# Patient Record
Sex: Male | Born: 1980 | Race: Black or African American | Hispanic: No | Marital: Single | Smoking: Never smoker
Health system: Southern US, Community
[De-identification: ages and names within clinical notes are randomized; demographics above are authoritative.]

---

## 2015-11-06 ENCOUNTER — Emergency Department (HOSPITAL_COMMUNITY): Payer: Self-pay

## 2015-11-06 ENCOUNTER — Encounter (HOSPITAL_COMMUNITY): Payer: Self-pay | Admitting: Emergency Medicine

## 2015-11-06 DIAGNOSIS — R630 Anorexia: Secondary | ICD-10-CM | POA: Insufficient documentation

## 2015-11-06 DIAGNOSIS — R0989 Other specified symptoms and signs involving the circulatory and respiratory systems: Secondary | ICD-10-CM | POA: Insufficient documentation

## 2015-11-06 DIAGNOSIS — M549 Dorsalgia, unspecified: Secondary | ICD-10-CM | POA: Insufficient documentation

## 2015-11-06 DIAGNOSIS — R05 Cough: Secondary | ICD-10-CM | POA: Insufficient documentation

## 2015-11-06 DIAGNOSIS — R509 Fever, unspecified: Secondary | ICD-10-CM | POA: Insufficient documentation

## 2015-11-06 DIAGNOSIS — R5383 Other fatigue: Secondary | ICD-10-CM | POA: Insufficient documentation

## 2015-11-06 NOTE — ED Notes (Signed)
Pt. reports productive cough , chest congestion , low/mid back pain and fatigue onset yesterday .

## 2015-11-07 ENCOUNTER — Emergency Department (HOSPITAL_COMMUNITY)
Admission: EM | Admit: 2015-11-07 | Discharge: 2015-11-07 | Disposition: A | Discharge status: Home / Self Care | Payer: Self-pay | Attending: Emergency Medicine | Admitting: Emergency Medicine

## 2015-11-07 DIAGNOSIS — R05 Cough: Secondary | ICD-10-CM

## 2015-11-07 DIAGNOSIS — R509 Fever, unspecified: Secondary | ICD-10-CM

## 2015-11-07 DIAGNOSIS — R059 Cough, unspecified: Secondary | ICD-10-CM

## 2015-11-07 MED ORDER — IBUPROFEN 800 MG PO TABS
800.0000 mg | ORAL_TABLET | Freq: Once | ORAL | Status: AC
Start: 2015-11-07 — End: 2015-11-07
  Administered 2015-11-07: 800 mg via ORAL
  Filled 2015-11-07: qty 1

## 2015-11-07 MED ORDER — OXYCODONE-ACETAMINOPHEN 5-325 MG PO TABS
1.0000 | ORAL_TABLET | Freq: Once | ORAL | Status: AC
  Administered 2015-11-07: 1 via ORAL
  Filled 2015-11-07: qty 1

## 2015-11-07 MED ORDER — BENZONATATE 100 MG PO CAPS: 100.0000 mg | ORAL_CAPSULE | Freq: Three times a day (TID) | ORAL | Status: AC

## 2015-11-07 MED ORDER — HYDROCODONE-HOMATROPINE 5-1.5 MG/5ML PO SYRP: 5.0000 mL | ORAL_SOLUTION | Freq: Four times a day (QID) | ORAL | Status: AC | PRN

## 2015-11-07 MED ORDER — IBUPROFEN 800 MG PO TABS: 800.0000 mg | ORAL_TABLET | Freq: Three times a day (TID) | ORAL | Status: AC

## 2015-11-07 NOTE — Discharge Instructions (Signed)

## 2015-11-07 NOTE — ED Provider Notes (Signed)
CSN: [id number]     Arrival date & time 11/06/15  2008 History   By signing my name below, I, Arlan Organ, attest that this documentation has been prepared under the direction and in the presence of Gilda Crease, MD.  Electronically Signed: Arlan Organ, ED Scribe. 11/07/2015. 1:11 AM.   Chief Complaint  Patient presents with  . Fever  . Cough  . Back Pain  . Fatigue   The history is provided by the patient. No language interpreter was used.    HPI Comments: Bobby Ferguson is a 35 y.o. male without any pertinent past medical history who presents to the Emergency Department complaining of constant, ongoing generalized body aches x 2 days. Pt also reports cough, chest congestion, lack of appetite, generalized weakness, HA, back pain, fever, and fatigue. No aggravating or alleviating factors. No OTC medications or home remedies attempted prior to arrival. No recent nausea, vomiting, diarrhea, or abdominal pain. No known allergies to medications.  PCP: No PCP Per Patient    History reviewed. No pertinent past medical history. History reviewed. No pertinent past surgical history. No family history on file. Social History  Substance Use Topics  . Smoking status: Never Smoker   . Smokeless tobacco: None  . Alcohol Use: Yes    Review of Systems  Constitutional: Positive for fever, appetite change and fatigue. Negative for chills.  HENT: Positive for congestion.   Respiratory: Positive for cough.   Cardiovascular: Negative for chest pain.  Gastrointestinal: Negative for nausea, vomiting, abdominal pain and diarrhea.  Musculoskeletal: Positive for myalgias and back pain.  Neurological: Positive for headaches.  Psychiatric/Behavioral: Negative for confusion.  All other systems reviewed and are negative.     Allergies  Review of patient's allergies indicates no known allergies.  Home Medications   Prior to Admission medications   Not on File   Triage Vitals: BP  131/82 mmHg  Pulse 84  Temp(Src) 99.9 F (37.7 C) (Oral)  Resp 14  Ht  (1.905 m)  Wt 203 lb (92.08 kg)  BMI 25.37 kg/m2  SpO2 99%   Physical Exam  Constitutional: He is oriented to person, place, and time. He appears well-developed and well-nourished. No distress.  HENT:  Head: Normocephalic and atraumatic.  Right Ear: Hearing normal.  Left Ear: Hearing normal.  Nose: Nose normal.  Mouth/Throat: Oropharynx is clear and moist and mucous membranes are normal.  Eyes: Conjunctivae and EOM are normal. Pupils are equal, round, and reactive to light.  Neck: Normal range of motion. Neck supple.  Cardiovascular: Regular rhythm, S1 normal and S2 normal.  Exam reveals no gallop and no friction rub.   No murmur heard. Pulmonary/Chest: Effort normal and breath sounds normal. No respiratory distress. He exhibits no tenderness.  Abdominal: Soft. Normal appearance and bowel sounds are normal. There is no hepatosplenomegaly. There is no tenderness. There is no rebound, no guarding, no tenderness at McBurney's point and negative Murphy's sign. No hernia.  Musculoskeletal: Normal range of motion.  Neurological: He is alert and oriented to person, place, and time. He has normal strength. No cranial nerve deficit or sensory deficit. Coordination normal. GCS eye subscore is 4. GCS verbal subscore is 5. GCS motor subscore is 6.  Skin: Skin is warm, dry and intact. No rash noted. No cyanosis.  Psychiatric: He has a normal mood and affect. His speech is normal and behavior is normal. Thought content normal.  Nursing note and vitals reviewed.   ED Course  Procedures (including  critical care time)  DIAGNOSTIC STUDIES: Oxygen Saturation is 99% on RA, Normal by my interpretation.    COORDINATION OF CARE: 1:08 AM- Will order CXR. Discussed treatment plan with pt at bedside and pt agreed to plan.     Labs Review Labs Reviewed - No data to display  Imaging Review Dg Chest 2 View  11/06/2015   CLINICAL DATA:  Productive cough and fever for 2 days. EXAM: CHEST  2 VIEW COMPARISON:  None. FINDINGS: The cardiomediastinal contours are normal. The lungs are clear. Pulmonary vasculature is normal. No consolidation, pleural effusion, or pneumothorax. No acute osseous abnormalities are seen. IMPRESSION: No acute pulmonary process. Electronically Signed   By: Rubye OaksMelanie  Ehinger M.D.   On: 11/06/2015 21:00   I have personally reviewed and evaluated these images and lab results as part of my medical decision-making.   EKG Interpretation None      MDM   Final diagnoses:  Cough  Fever    Patient presents to the emergency department for flulike illness. Patient has been experiencing fever, chills, myalgias, malaise with productive cough for 2 days. Examination is unremarkable. He is well-appearing. There is no hypoxia. Chest x-ray is clear, no evidence of pneumonia. Symptoms most consistent with viral illness, possibly influenza. Will treat symptomatically.  I personally performed the services described in this documentation, which was scribed in my presence. The recorded information has been reviewed and is accurate.   Gilda Creasehristopher J Pollina, MD 11/07/15 [phone]

## 2015-11-07 NOTE — ED Notes (Signed)
Pt A&OX4, ambulatory at d/c with steady gait, NAD 

## 2016-09-29 IMAGING — CR DG CHEST 2V
2 series · 2 of 2 positions shown · non-contrast
Comparison: None.

CLINICAL DATA: Productive cough and fever for 2 days.

EXAM:
CHEST  2 VIEW

[chest pa]
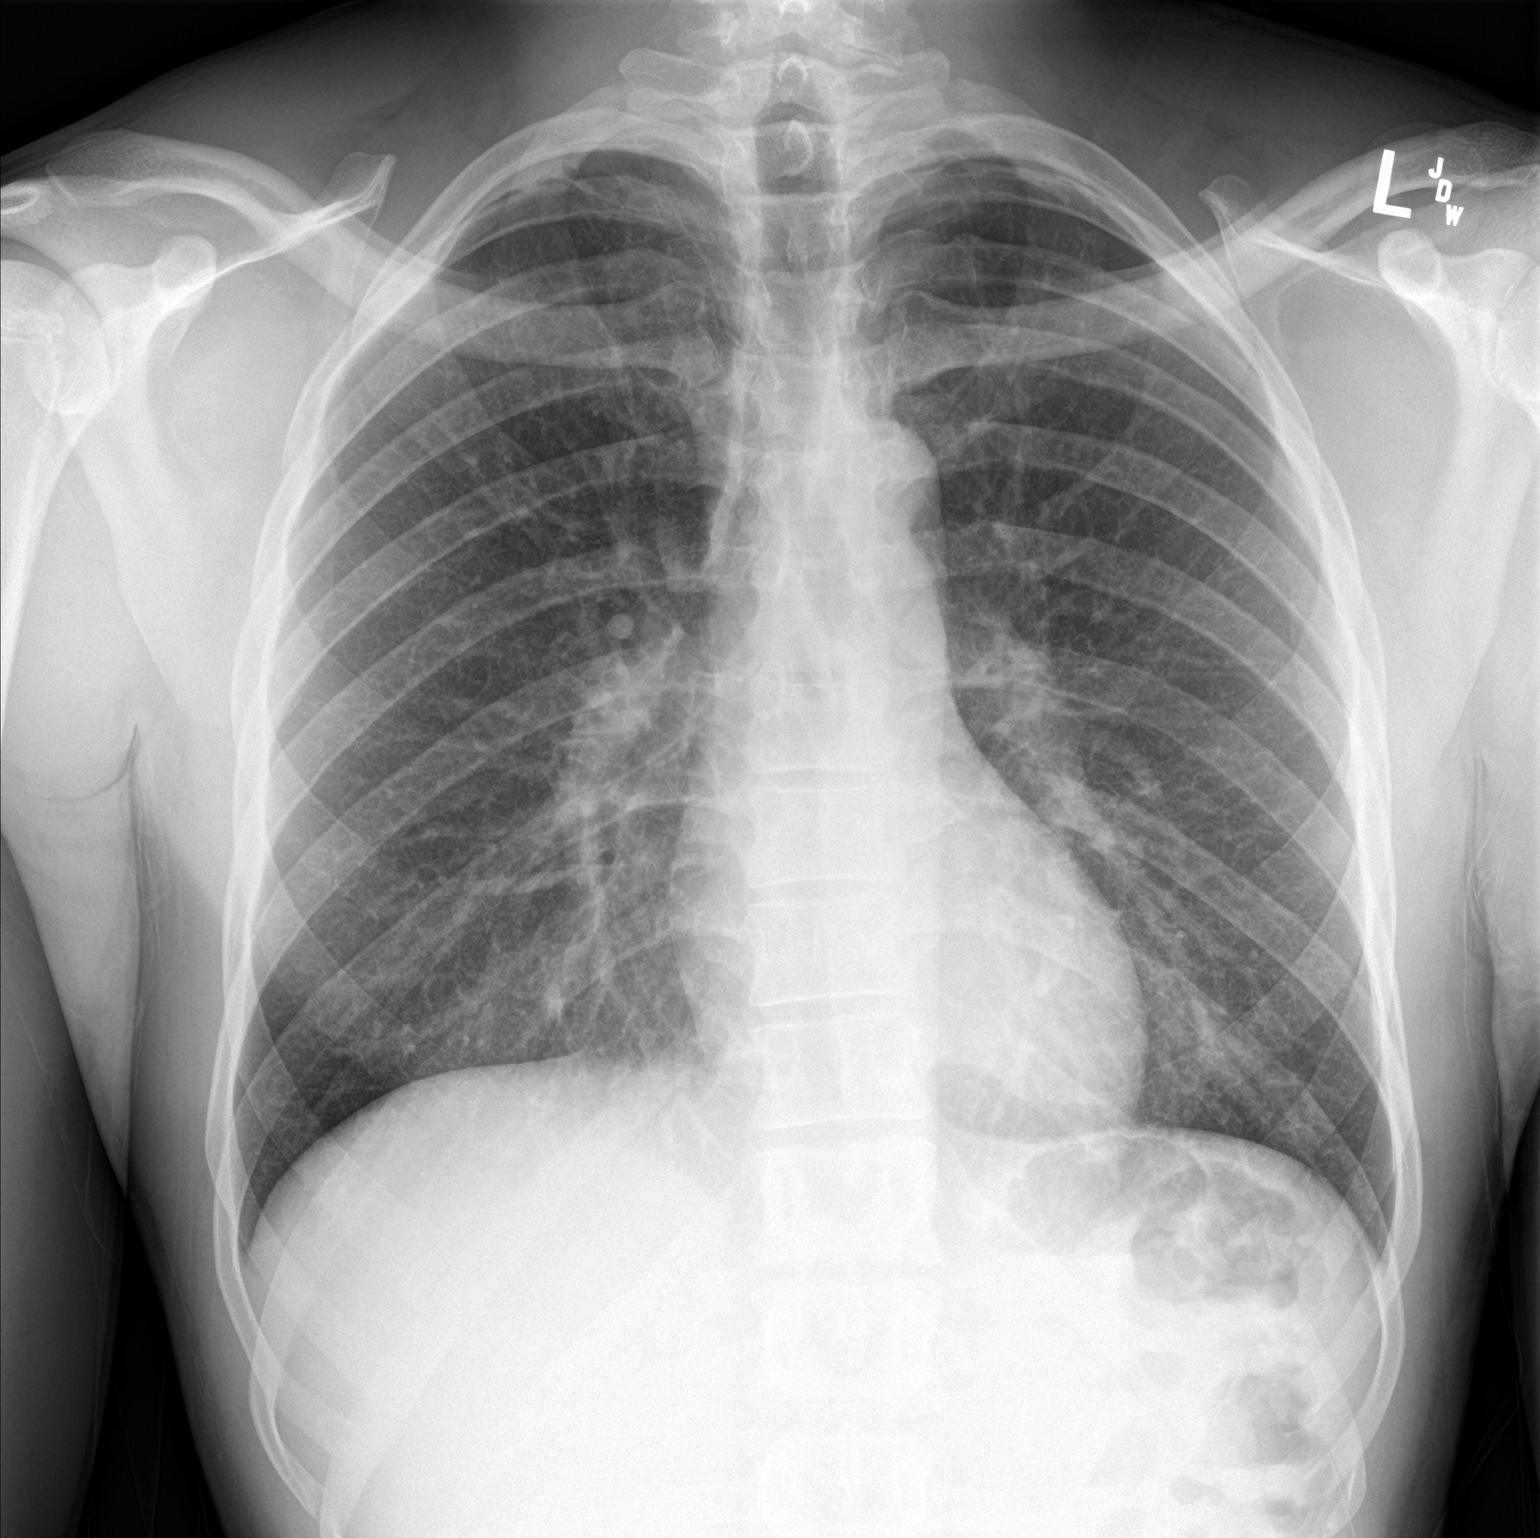

[chest lat]
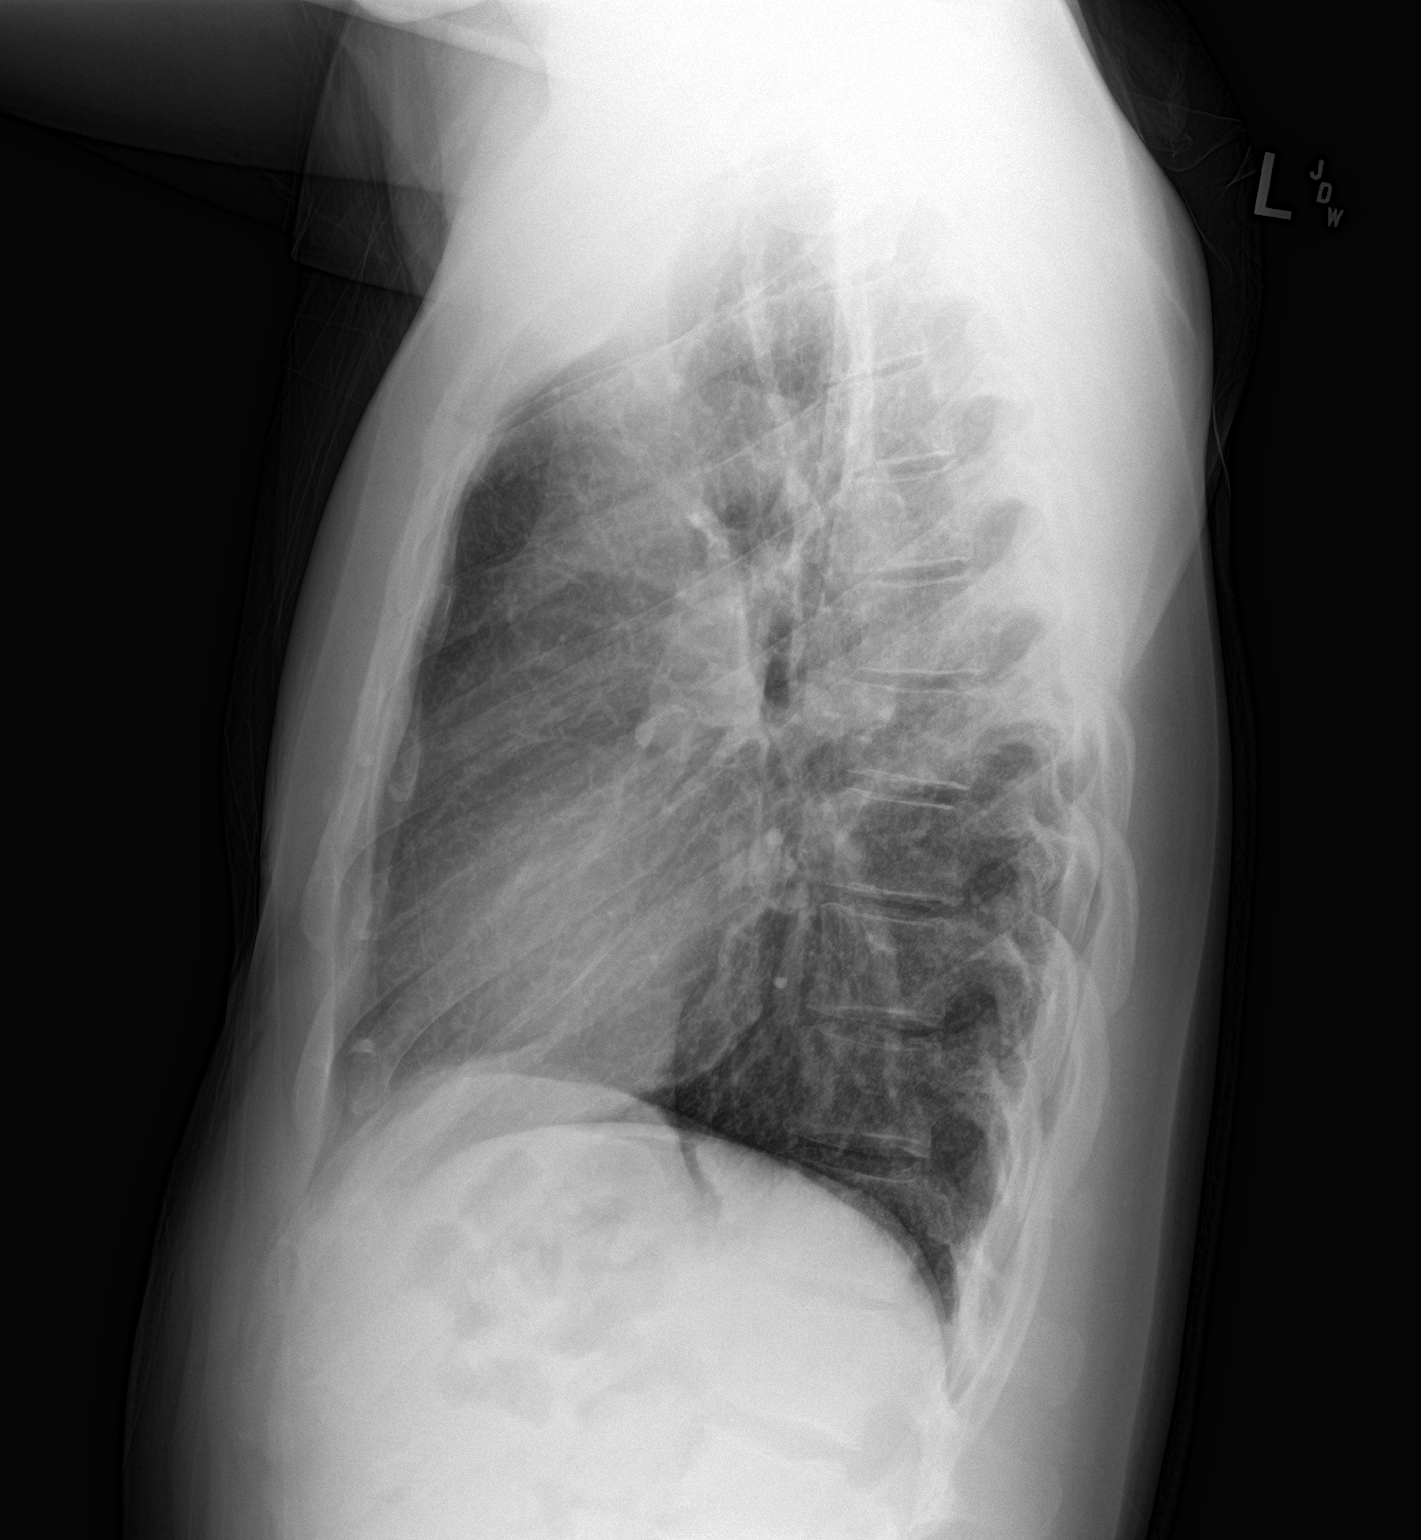

[2 of 2 positions shown; findings below may reference images not displayed]

FINDINGS: The cardiomediastinal contours are normal. The lungs are clear.
Pulmonary vasculature is normal. No consolidation, pleural effusion,
or pneumothorax. No acute osseous abnormalities are seen.
IMPRESSION: No acute pulmonary process.

## 2020-05-28 ENCOUNTER — Other Ambulatory Visit: Payer: Self-pay

## 2020-05-28 DIAGNOSIS — Z20822 Contact with and (suspected) exposure to covid-19: Secondary | ICD-10-CM

## 2020-05-29 LAB — SARS-COV-2, NAA 2 DAY TAT

## 2020-05-29 LAB — NOVEL CORONAVIRUS, NAA: SARS-CoV-2, NAA: NOT DETECTED
